# Patient Record
Sex: Female | Born: 1935 | Race: White | Hispanic: No | Smoking: Never smoker
Health system: Southern US, Community
[De-identification: ages and names within clinical notes are randomized; demographics above are authoritative.]

## PROBLEM LIST (undated history)

## (undated) DIAGNOSIS — E039 Hypothyroidism, unspecified: Secondary | ICD-10-CM

## (undated) DIAGNOSIS — J939 Pneumothorax, unspecified: Secondary | ICD-10-CM

## (undated) DIAGNOSIS — I10 Essential (primary) hypertension: Secondary | ICD-10-CM

## (undated) DIAGNOSIS — E119 Type 2 diabetes mellitus without complications: Secondary | ICD-10-CM

## (undated) DIAGNOSIS — D649 Anemia, unspecified: Secondary | ICD-10-CM

## (undated) DIAGNOSIS — E7800 Pure hypercholesterolemia, unspecified: Secondary | ICD-10-CM

## (undated) DIAGNOSIS — C801 Malignant (primary) neoplasm, unspecified: Secondary | ICD-10-CM

## (undated) HISTORY — PX: OTHER SURGICAL HISTORY: SHX169

## (undated) HISTORY — PX: BACK SURGERY: SHX140

## (undated) HISTORY — PX: ABDOMINAL HYSTERECTOMY: SHX81

---

## 2004-08-14 ENCOUNTER — Ambulatory Visit: Payer: Self-pay | Admitting: Internal Medicine

## 2004-09-21 ENCOUNTER — Ambulatory Visit: Payer: Self-pay | Admitting: Unknown Physician Specialty

## 2005-10-17 ENCOUNTER — Ambulatory Visit: Payer: Self-pay | Admitting: Internal Medicine

## 2006-04-16 ENCOUNTER — Ambulatory Visit: Payer: Self-pay | Admitting: Internal Medicine

## 2006-10-20 ENCOUNTER — Ambulatory Visit: Payer: Self-pay | Admitting: Internal Medicine

## 2007-12-16 ENCOUNTER — Ambulatory Visit: Payer: Self-pay | Admitting: Internal Medicine

## 2008-12-30 ENCOUNTER — Ambulatory Visit: Payer: Self-pay | Admitting: Internal Medicine

## 2010-01-16 ENCOUNTER — Ambulatory Visit: Payer: Self-pay | Admitting: Unknown Physician Specialty

## 2010-01-16 HISTORY — PX: OTHER SURGICAL HISTORY: SHX169

## 2010-01-31 ENCOUNTER — Ambulatory Visit: Payer: Self-pay | Admitting: Internal Medicine

## 2011-02-13 ENCOUNTER — Ambulatory Visit: Payer: Self-pay | Admitting: Internal Medicine

## 2011-02-28 ENCOUNTER — Ambulatory Visit: Payer: Self-pay | Admitting: Urology

## 2011-08-01 ENCOUNTER — Emergency Department: Payer: Self-pay | Admitting: Emergency Medicine

## 2011-08-01 LAB — COMPREHENSIVE METABOLIC PANEL
Albumin: 4.2 g/dL (ref 3.4–5.0)
Anion Gap: 14 (ref 7–16)
Calcium, Total: 9.5 mg/dL (ref 8.5–10.1)
Chloride: 96 mmol/L — ABNORMAL LOW (ref 98–107)
EGFR (African American): 35 — ABNORMAL LOW
EGFR (Non-African Amer.): 29 — ABNORMAL LOW
Glucose: 129 mg/dL — ABNORMAL HIGH (ref 65–99)
Osmolality: 283 (ref 275–301)
Potassium: 3.8 mmol/L (ref 3.5–5.1)
SGOT(AST): 57 U/L — ABNORMAL HIGH (ref 15–37)
SGPT (ALT): 53 U/L
Total Protein: 7.9 g/dL (ref 6.4–8.2)

## 2011-08-01 LAB — URINALYSIS, COMPLETE
Bilirubin,UR: NEGATIVE
Glucose,UR: NEGATIVE mg/dL (ref 0–75)
Ketone: NEGATIVE
Leukocyte Esterase: NEGATIVE
Nitrite: NEGATIVE
Ph: 5 (ref 4.5–8.0)
RBC,UR: 1 /HPF (ref 0–5)
Specific Gravity: 1.003 (ref 1.003–1.030)

## 2011-08-01 LAB — CBC
MCH: 33.5 pg (ref 26.0–34.0)
MCHC: 33.9 g/dL (ref 32.0–36.0)
Platelet: 311 10*3/uL (ref 150–440)
RBC: 4.13 10*6/uL (ref 3.80–5.20)
RDW: 11.9 % (ref 11.5–14.5)

## 2011-08-01 LAB — TROPONIN I: Troponin-I: <0.02 ng/mL

## 2012-02-17 ENCOUNTER — Ambulatory Visit: Payer: Self-pay | Admitting: Internal Medicine

## 2012-07-07 IMAGING — US US RENAL KIDNEY
1 series · 17 of 25 positions shown · non-contrast
Comparison: none

REASON FOR EXAM: gross hematuria  atrophic vaginitis
COMMENTS:

[Series 1: us renal kidney · 17 of 53 slices shown]
[im 1/53]
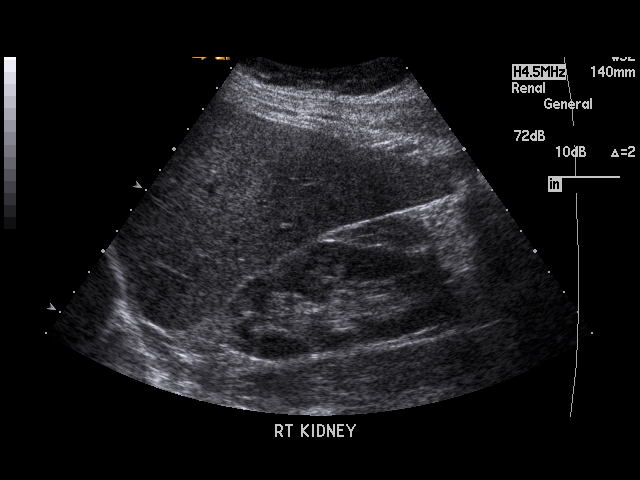
[im 5/53]
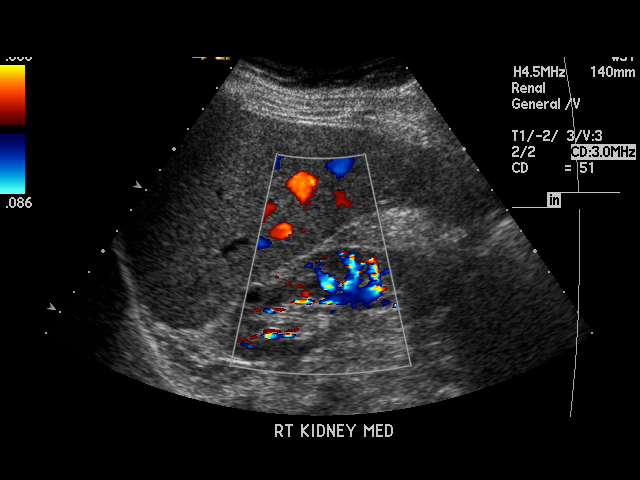
[im 7/53]
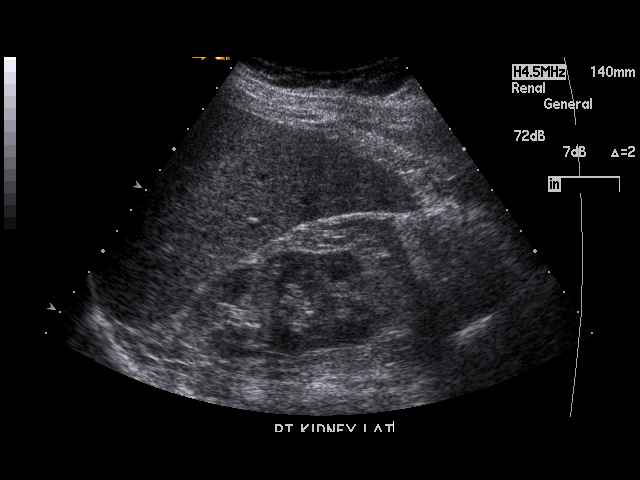
[im 11/53]
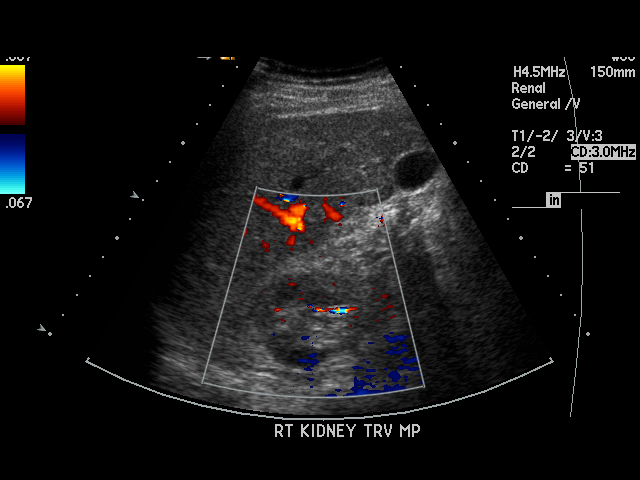
[im 14/53]
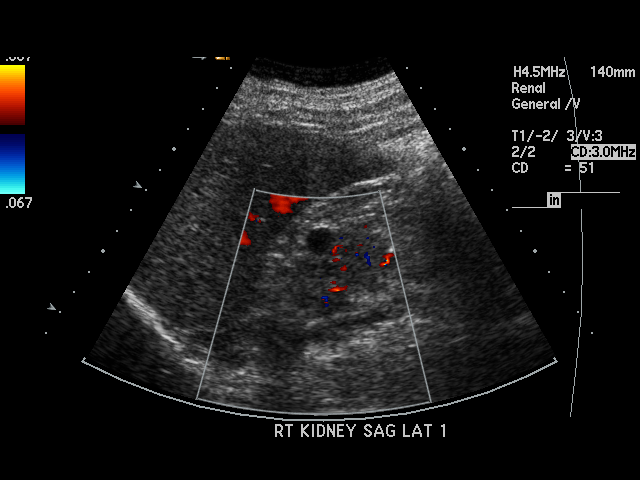
[im 18/53]
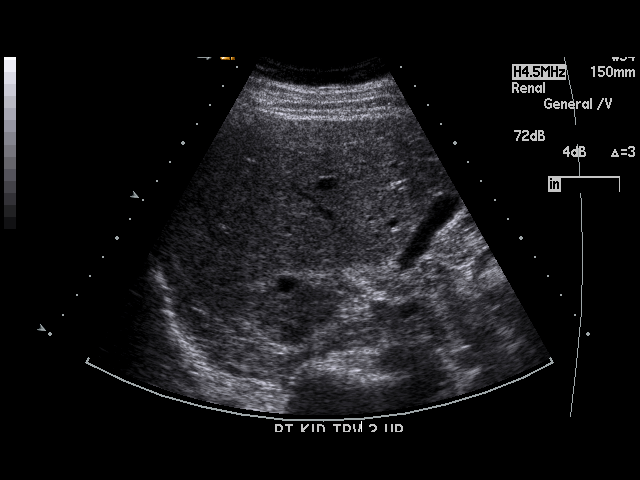
[im 20/53]
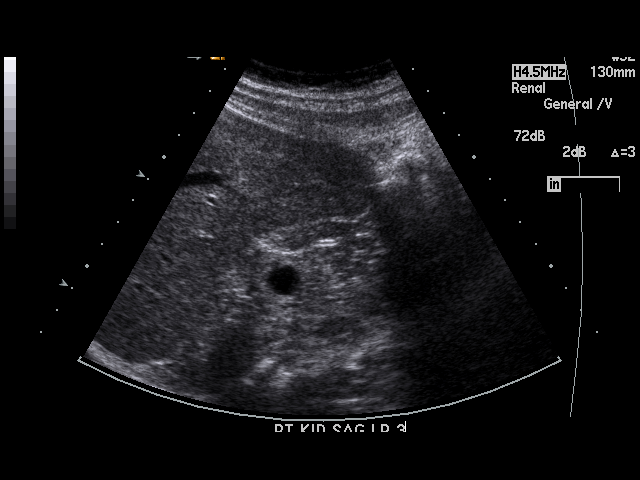
[im 24/53]
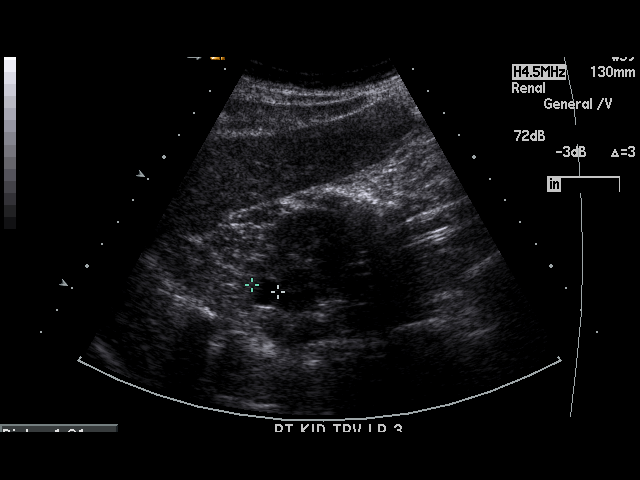
[im 27/53]
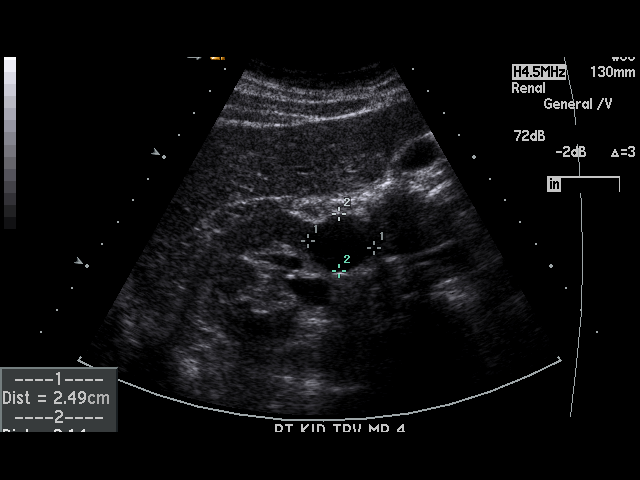
[im 29/53]
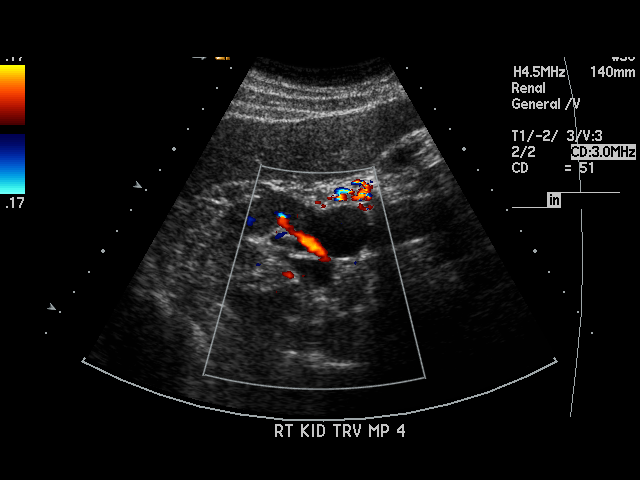
[im 33/53]
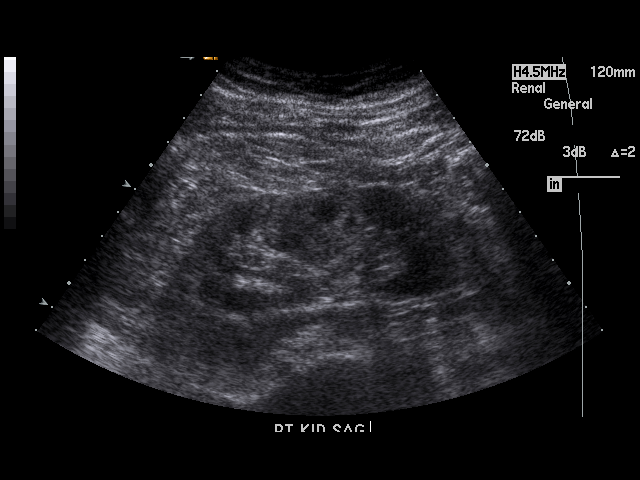
[im 35/53]
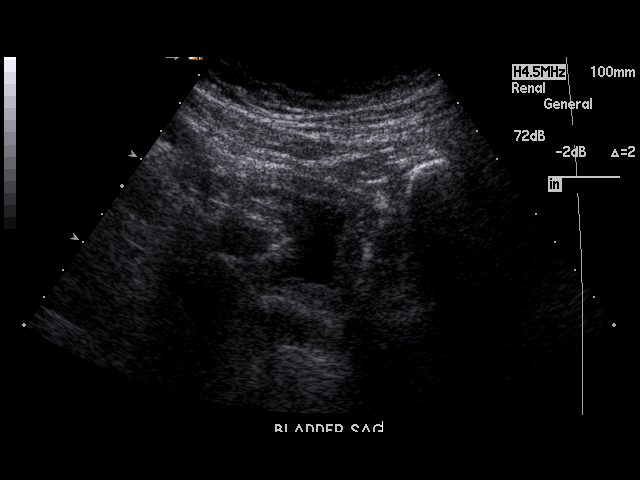
[im 40/53]
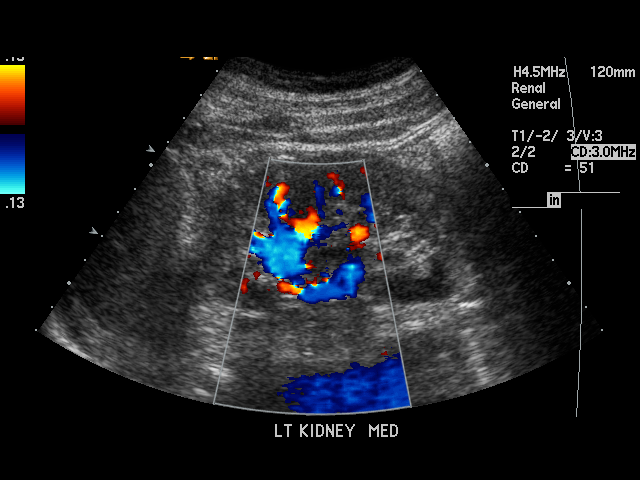
[im 42/53]
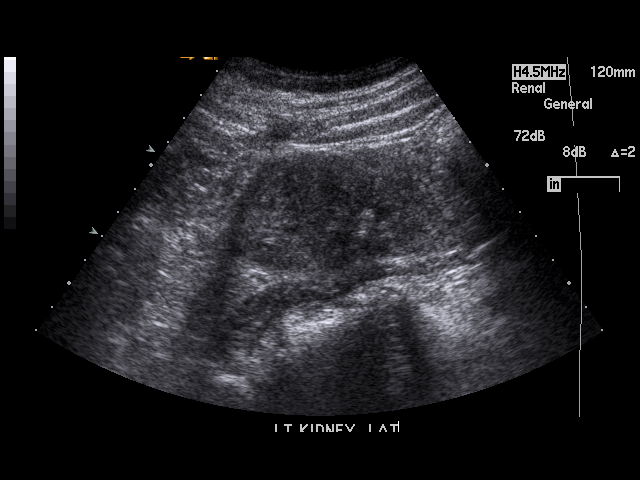
[im 46/53]
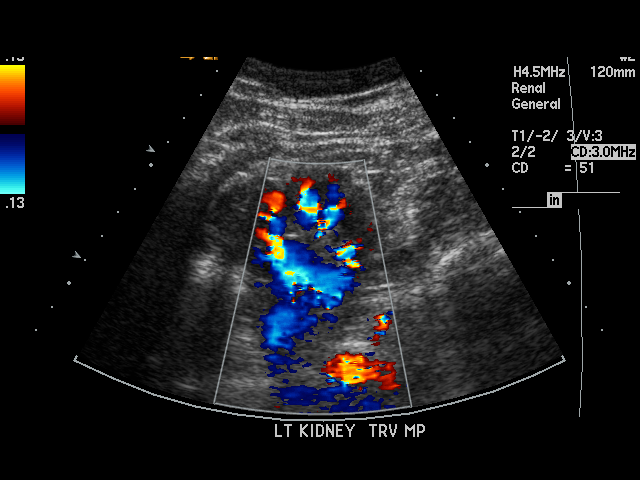
[im 48/53]
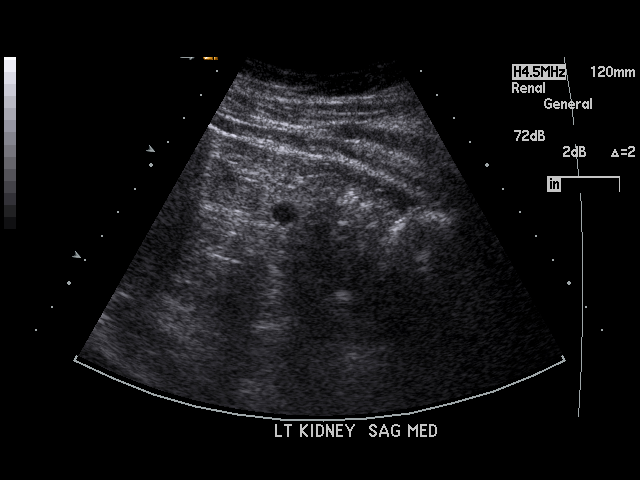
[im 53/53]
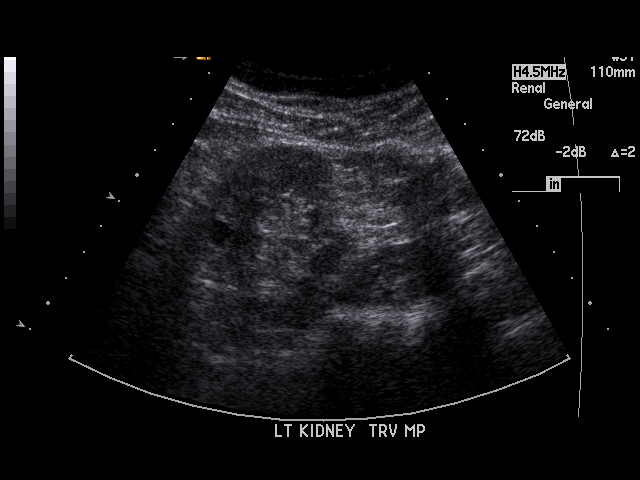

[17 of 25 positions shown; findings below may reference images not displayed]

PROCEDURE:     TIN FUNG - TIN FUNG KIDNEYS  - February 28, 2011  [DATE]

RESULT:     The right kidney measures 9.72 cm x 4.82 cm x 4.0 8 cm and the
left kidney measures 9.04 cm x 4.29 cm x 4.2 cm. No hydronephrosis or other
acute renal abnormality is identified. No solid renal mass lesions are seen.
No renal calcifications are identified. There are multiple right renal cysts
with the largest measuring 2.49 cm at maximum diameter. There is an 8.5 mm
cyst of the left kidney. The visualized portion of the poorly filled urinary
bladder shows no significant abnormalities.
IMPRESSION: 1. No hydronephrosis or other acute renal abnormality is identified.
2. Bilateral renal cysts are noted.

## 2013-02-17 ENCOUNTER — Ambulatory Visit: Payer: Self-pay | Admitting: Internal Medicine

## 2014-06-03 ENCOUNTER — Ambulatory Visit: Payer: Self-pay | Admitting: Internal Medicine

## 2014-07-23 ENCOUNTER — Emergency Department: Payer: Self-pay | Admitting: Emergency Medicine

## 2014-09-23 ENCOUNTER — Ambulatory Visit
Admit: 2014-09-23 | Disposition: A | Discharge status: Home / Self Care | Payer: Self-pay | Attending: Physical Medicine and Rehabilitation | Admitting: Physical Medicine and Rehabilitation

## 2015-03-15 ENCOUNTER — Encounter: Payer: Self-pay | Admitting: *Deleted

## 2015-03-16 ENCOUNTER — Encounter: Payer: Self-pay | Admitting: Anesthesiology

## 2015-03-16 ENCOUNTER — Ambulatory Visit: Payer: Medicare Other | Admitting: Anesthesiology

## 2015-03-16 ENCOUNTER — Encounter
Admission: RE | Disposition: A | Discharge status: Home / Self Care | Payer: Self-pay | Source: Ambulatory Visit | Attending: Unknown Physician Specialty

## 2015-03-16 ENCOUNTER — Ambulatory Visit
Admission: RE | Admit: 2015-03-16 | Discharge: 2015-03-16 | Disposition: A | Discharge status: Home / Self Care | Payer: Medicare Other | Source: Ambulatory Visit | Attending: Unknown Physician Specialty | Admitting: Unknown Physician Specialty

## 2015-03-16 DIAGNOSIS — K5730 Diverticulosis of large intestine without perforation or abscess without bleeding: Secondary | ICD-10-CM | POA: Diagnosis not present

## 2015-03-16 DIAGNOSIS — E119 Type 2 diabetes mellitus without complications: Secondary | ICD-10-CM | POA: Diagnosis not present

## 2015-03-16 DIAGNOSIS — E7800 Pure hypercholesterolemia, unspecified: Secondary | ICD-10-CM | POA: Insufficient documentation

## 2015-03-16 DIAGNOSIS — I10 Essential (primary) hypertension: Secondary | ICD-10-CM | POA: Insufficient documentation

## 2015-03-16 DIAGNOSIS — D125 Benign neoplasm of sigmoid colon: Secondary | ICD-10-CM | POA: Diagnosis not present

## 2015-03-16 DIAGNOSIS — K640 First degree hemorrhoids: Secondary | ICD-10-CM | POA: Insufficient documentation

## 2015-03-16 DIAGNOSIS — Z79899 Other long term (current) drug therapy: Secondary | ICD-10-CM | POA: Insufficient documentation

## 2015-03-16 DIAGNOSIS — E039 Hypothyroidism, unspecified: Secondary | ICD-10-CM | POA: Insufficient documentation

## 2015-03-16 DIAGNOSIS — D124 Benign neoplasm of descending colon: Secondary | ICD-10-CM | POA: Insufficient documentation

## 2015-03-16 DIAGNOSIS — Z86010 Personal history of colonic polyps: Secondary | ICD-10-CM | POA: Diagnosis present

## 2015-03-16 HISTORY — DX: Pneumothorax, unspecified: J93.9

## 2015-03-16 HISTORY — DX: Type 2 diabetes mellitus without complications: E11.9

## 2015-03-16 HISTORY — DX: Essential (primary) hypertension: I10

## 2015-03-16 HISTORY — DX: Anemia, unspecified: D64.9

## 2015-03-16 HISTORY — PX: COLONOSCOPY WITH PROPOFOL: SHX5780

## 2015-03-16 HISTORY — DX: Pure hypercholesterolemia, unspecified: E78.00

## 2015-03-16 HISTORY — DX: Hypothyroidism, unspecified: E03.9

## 2015-03-16 LAB — GLUCOSE, CAPILLARY: GLUCOSE-CAPILLARY: 120 mg/dL — AB (ref 65–99)

## 2015-03-16 SURGERY — COLONOSCOPY WITH PROPOFOL
Anesthesia: General

## 2015-03-16 MED ORDER — LIDOCAINE HCL (PF) 2 % IJ SOLN
INTRAMUSCULAR | Status: DC | PRN
  Administered 2015-03-16: 50 mg

## 2015-03-16 MED ORDER — PROPOFOL 10 MG/ML IV BOLUS
INTRAVENOUS | Status: DC | PRN
  Administered 2015-03-16 (×2): 10 mg via INTRAVENOUS
  Administered 2015-03-16: 30 mg via INTRAVENOUS
  Administered 2015-03-16: 10 mg via INTRAVENOUS

## 2015-03-16 MED ORDER — SODIUM CHLORIDE 0.9 % IV SOLN
INTRAVENOUS | Status: DC
  Administered 2015-03-16: 1000 mL via INTRAVENOUS
  Administered 2015-03-16: 08:00:00 via INTRAVENOUS

## 2015-03-16 MED ORDER — PROPOFOL 500 MG/50ML IV EMUL
INTRAVENOUS | Status: DC | PRN
  Administered 2015-03-16: 50 ug/kg/min via INTRAVENOUS

## 2015-03-16 MED ORDER — SODIUM CHLORIDE 0.9 % IV SOLN
INTRAVENOUS | Status: DC
Start: 2015-03-16 — End: 2015-03-16

## 2015-03-16 MED ORDER — FENTANYL CITRATE (PF) 100 MCG/2ML IJ SOLN
INTRAMUSCULAR | Status: DC | PRN
  Administered 2015-03-16: 50 ug via INTRAVENOUS

## 2015-03-16 NOTE — Transfer of Care (Signed)
Immediate Anesthesia Transfer of Care Note  Patient: Kim Hoover  Procedure(s) Performed: Procedure(s): COLONOSCOPY WITH PROPOFOL (N/A)  Patient Location: PACU  Anesthesia Type:General  Level of Consciousness: sedated  Airway & Oxygen Therapy: Patient Spontanous Breathing and Patient connected to nasal cannula oxygen  Post-op Assessment: Report given to RN and Post -op Vital signs reviewed and stable  Post vital signs: Reviewed and stable  Last Vitals:  Filed Vitals:   03/16/15 0718  BP: 142/54  Pulse: 69  Temp: 37.5 C  Resp: 16    Complications: No apparent anesthesia complications

## 2015-03-16 NOTE — Op Note (Signed)
[facility] Gastroenterology Patient Name: Kim Hoover Procedure Date: 03/16/2015 8:04 AM MRN: [id number] Account #: [id number] Date of Birth: 25-Dec-1933 Admit Type: Outpatient Age: 81 Room: [facility] ENDO ROOM 1 Gender: Female Note Status: Finalized Procedure:         Colonoscopy Indications:       High risk colon cancer surveillance: Personal history of                     colonic polyps Providers:         Manya Silvas, MD Referring MD:      Ocie Cornfield. Ouida Sills, MD (Referring MD) Medicines:         Propofol per Anesthesia Complications:     No immediate complications. Procedure:         Pre-Anesthesia Assessment:                    - After reviewing the risks and benefits, the patient was                     deemed in satisfactory condition to undergo the procedure.                    After obtaining informed consent, the colonoscope was                     passed under direct vision. Throughout the procedure, the                     patient's blood pressure, pulse, and oxygen saturations                     were monitored continuously. The Colonoscope was                     introduced through the anus and advanced to the the cecum,                     identified by appendiceal orifice and ileocecal valve. The                     colonoscopy was performed without difficulty. The patient                     tolerated the procedure well. The quality of the bowel                     preparation was excellent. Findings:      Three sessile polyps were found in the recto-sigmoid colon and in the       descending colon. The polyps were diminutive in size. These polyps were       removed with a jumbo cold forceps. Resection and retrieval were complete.      A few small-mouthed diverticula were found in the transverse colon.      Internal hemorrhoids were found during endoscopy. The hemorrhoids were       small and Grade I (internal hemorrhoids that do not  prolapse). Impression:        - Three diminutive polyps at the recto-sigmoid colon and                     in the descending colon. Resected and retrieved.                    -  Diverticulosis in the transverse colon.                    - Internal hemorrhoids. Recommendation:    - Await pathology results. Due to age no repeat                     recommended. Manya Silvas, MD 03/16/2015 8:36:05 AM This report has been signed electronically. Number of Addenda: 0 Note Initiated On: 03/16/2015 8:04 AM Scope Withdrawal Time: 0 hours 11 minutes 41 seconds  Total Procedure Duration: 0 hours 23 minutes 20 seconds       [facility]

## 2015-03-16 NOTE — H&P (Signed)
Primary Care Physician:  Kirk Ruths., MD Primary Gastroenterologist:  Dr. Vira Agar  Pre-Procedure History & Physical: HPI:  Kim Hoover is a 81 y.o. female is here for an colonoscopy.   Past Medical History  Diagnosis Date  . Hypertension   . Hypothyroidism   . Diabetes mellitus without complication ([place])   . Anemia   . Pure hypercholesterolemia   . Pneumothorax     Past Surgical History  Procedure Laterality Date  . Abdominal hysterectomy    . Back surgery    . Enucleation of eye    . Colon polyps  01/16/10    Prior to Admission medications   Medication Sig Start Date End Date Taking? Authorizing Provider  bisacodyl (BISACODYL) 5 MG EC tablet Take 5 mg by mouth daily as needed for moderate constipation.   Yes Historical Provider, MD  carvedilol (COREG) 25 MG tablet Take 25 mg by mouth 2 (two) times daily with a meal.   Yes Historical Provider, MD  HYDROcodone-acetaminophen (NORCO/VICODIN) 5-325 MG tablet Take 1 tablet by mouth every 6 (six) hours as needed for moderate pain.   Yes Historical Provider, MD  levothyroxine (SYNTHROID, LEVOTHROID) 50 MCG tablet Take 50 mcg by mouth daily before breakfast.   Yes Historical Provider, MD  losartan-hydrochlorothiazide (HYZAAR) 50-12.5 MG tablet Take 1 tablet by mouth daily.   Yes Historical Provider, MD  Multiple Vitamins-Iron (MULTIVITAMINS WITH IRON) TABS tablet Take 1 tablet by mouth daily.   Yes Historical Provider, MD  PARoxetine (PAXIL) 20 MG tablet Take 20 mg by mouth daily.   Yes Historical Provider, MD  polyethylene glycol-electrolytes (NULYTELY/GOLYTELY) 420 G solution Take 4,000 mLs by mouth once.   Yes Historical Provider, MD  simvastatin (ZOCOR) 40 MG tablet Take 40 mg by mouth daily.   Yes Historical Provider, MD  felodipine (PLENDIL) 10 MG 24 hr tablet Take 10 mg by mouth daily.    Historical Provider, MD    Allergies as of 01/11/2015  . (Not on File)    History reviewed. No pertinent family  history.  Social History   Social History  . Marital Status: Married    Spouse Name: N/A  . Number of Children: N/A  . Years of Education: N/A   Occupational History  . Not on file.   Social History Main Topics  . Smoking status: Never Smoker   . Smokeless tobacco: Not on file  . Alcohol Use: Not on file  . Drug Use: Not on file  . Sexual Activity: Not on file   Other Topics Concern  . Not on file   Social History Narrative  . No narrative on file    Review of Systems: See HPI, otherwise negative ROS  Physical Exam: BP 142/54 mmHg  Pulse 69  Temp(Src) 99.5 F (37.5 C) (Tympanic)  Resp 16  Ht 5\' 7"  (1.702 m)  Wt 68.04 kg (150 lb)  BMI 23.49 kg/m2  SpO2 98% General:   Alert,  pleasant and cooperative in NAD Head:  Normocephalic and atraumatic. Neck:  Supple; no masses or thyromegaly. Lungs:  Clear throughout to auscultation.    Heart:  Regular rate and rhythm. Abdomen:  Soft, nontender and nondistended. Normal bowel sounds, without guarding, and without rebound.   Neurologic:  Alert and  oriented x4;  grossly normal neurologically.  Impression/Plan: Kim Hoover is here for an colonoscopy to be performed for [facility] colon polyps  Risks, benefits, limitations, and alternatives regarding  colonoscopy have been reviewed with the patient.  Questions have been answered.  All parties agreeable.   Gaylyn Cheers, MD  03/16/2015, 8:00 AM

## 2015-03-16 NOTE — Anesthesia Preprocedure Evaluation (Signed)
Anesthesia Evaluation  Patient identified by MRN, date of birth, ID band Patient awake    Reviewed: Allergy & Precautions, H&P , NPO status , Patient's Chart, lab work & pertinent test results  History of Anesthesia Complications Negative for: history of anesthetic complications  Airway Mallampati: III  TM Distance: >3 FB Neck ROM: limited    Dental  (+) Poor Dentition   Pulmonary neg pulmonary ROS, neg shortness of breath,    Pulmonary exam normal breath sounds clear to auscultation       Cardiovascular Exercise Tolerance: Good hypertension, (-) angina(-) Past MI and (-) DOE negative cardio ROS Normal cardiovascular exam Rhythm:regular Rate:Normal     Neuro/Psych negative neurological ROS  negative psych ROS   GI/Hepatic negative GI ROS, Neg liver ROS,   Endo/Other  diabetes, Type 2Hypothyroidism   Renal/GU negative Renal ROS  negative genitourinary   Musculoskeletal   Abdominal   Peds  Hematology negative hematology ROS (+)   Anesthesia Other Findings Past Medical History:   Hypertension                                                 Hypothyroidism                                               Diabetes mellitus without complication ([place])                 Anemia                                                       Pure hypercholesterolemia                                    Pneumothorax                                                Past Surgical History:   ABDOMINAL HYSTERECTOMY                                        BACK SURGERY                                                  enucleation of eye                                            colon polyps  01/16/10     BMI    Body Mass Index   23.48 kg/m 2      Reproductive/Obstetrics negative OB ROS                             Anesthesia Physical Anesthesia Plan  ASA: III  Anesthesia Plan:  General   Post-op Pain Management:    Induction:   Airway Management Planned:   Additional Equipment:   Intra-op Plan:   Post-operative Plan:   Informed Consent: I have reviewed the patients History and Physical, chart, labs and discussed the procedure including the risks, benefits and alternatives for the proposed anesthesia with the patient or authorized representative who has indicated his/her understanding and acceptance.   Dental Advisory Given  Plan Discussed with: Anesthesiologist, CRNA and Surgeon  Anesthesia Plan Comments:         Anesthesia Quick Evaluation

## 2015-03-16 NOTE — Anesthesia Postprocedure Evaluation (Signed)
  Anesthesia Post-op Note  Patient: Kim Hoover  Procedure(s) Performed: Procedure(s): COLONOSCOPY WITH PROPOFOL (N/A)  Anesthesia type:General  Patient location: PACU  Post pain: Pain level controlled  Post assessment: Post-op Vital signs reviewed, Patient's Cardiovascular Status Stable, Respiratory Function Stable, Patent Airway and No signs of Nausea or vomiting  Post vital signs: Reviewed and stable  Last Vitals:  Filed Vitals:   03/16/15 0838  BP: 115/56  Pulse: 60  Temp: 35.8 C  Resp: 18    Level of consciousness: awake, alert  and patient cooperative  Complications: No apparent anesthesia complications

## 2015-03-17 ENCOUNTER — Encounter: Payer: Self-pay | Admitting: Unknown Physician Specialty

## 2015-03-17 LAB — SURGICAL PATHOLOGY

## 2015-06-01 ENCOUNTER — Other Ambulatory Visit: Payer: Self-pay | Admitting: Internal Medicine

## 2015-06-01 DIAGNOSIS — Z1231 Encounter for screening mammogram for malignant neoplasm of breast: Secondary | ICD-10-CM

## 2015-06-06 ENCOUNTER — Ambulatory Visit
Admission: RE | Admit: 2015-06-06 | Discharge: 2015-06-06 | Disposition: A | Discharge status: Home / Self Care | Payer: Medicare Other | Source: Ambulatory Visit | Attending: Internal Medicine | Admitting: Internal Medicine

## 2015-06-06 ENCOUNTER — Other Ambulatory Visit: Payer: Self-pay | Admitting: Internal Medicine

## 2015-06-06 DIAGNOSIS — Z1231 Encounter for screening mammogram for malignant neoplasm of breast: Secondary | ICD-10-CM | POA: Insufficient documentation

## 2015-06-06 HISTORY — DX: Malignant (primary) neoplasm, unspecified: C80.1

## 2018-06-20 ENCOUNTER — Other Ambulatory Visit
Admission: RE | Admit: 2018-06-20 | Discharge: 2018-06-20 | Disposition: A | Discharge status: Home / Self Care | Payer: Medicare Other | Source: Ambulatory Visit | Attending: Nurse Practitioner | Admitting: Nurse Practitioner

## 2018-06-20 DIAGNOSIS — R197 Diarrhea, unspecified: Secondary | ICD-10-CM | POA: Diagnosis present

## 2018-06-20 LAB — GASTROINTESTINAL PANEL BY PCR, STOOL (REPLACES STOOL CULTURE)

## 2018-06-20 LAB — C DIFFICILE QUICK SCREEN W PCR REFLEX
C Diff antigen: NEGATIVE
C Diff interpretation: NOT DETECTED
C Diff toxin: NEGATIVE

## 2019-02-04 ENCOUNTER — Other Ambulatory Visit: Payer: Self-pay

## 2019-02-04 ENCOUNTER — Ambulatory Visit: Payer: Medicare Other | Admitting: Urology

## 2019-02-04 ENCOUNTER — Encounter: Payer: Self-pay | Admitting: Urology

## 2019-02-04 VITALS — BP 151/76 | HR 75 | Ht 67.0 in | Wt 155.0 lb

## 2019-02-04 DIAGNOSIS — N390 Urinary tract infection, site not specified: Secondary | ICD-10-CM | POA: Diagnosis not present

## 2019-02-04 LAB — BLADDER SCAN AMB NON-IMAGING: Scan Result: 12

## 2019-02-04 MED ORDER — TRIMETHOPRIM 100 MG PO TABS: 100.0000 mg | ORAL_TABLET | Freq: Every day | ORAL | 2 refills | Status: DC

## 2019-02-04 NOTE — Progress Notes (Signed)
02/04/19 1:27 PM   Kim Hoover 03-06-1934 [id number]  Referring provider: Kirk Ruths, MD [facility] [place],  [place] [address]  CC: Recurrent UTIs  HPI: I saw Ms. Kim Hoover in urology clinic in consultation for recurrent UTIs from Dr. Ouida Sills.  She is a healthy 85 year old female that had an Enterobacter UTI in July 2020, and reportedly has a history of recurrent infections previously.  On chart review, she also had a Proteus UTI in January 2019 in June 2018.  She was previously followed by Dr. Ernst Spell over 10 years ago who told her she "had a nonhealing spot on her bladder."  It sounds like she had a cystoscopy and cytology that was negative, but no biopsy was ever performed.  It sounds like she may have been on prophylactic antibiotics at that time.  When she has a UTI, she has symptoms of dysuria and hematuria.  She denies any gross hematuria without associated UTI symptoms.  She denies any abdominal pain, flank pain, or weight loss.  She is a never smoker and previously worked as a Pharmacist, hospital.  There are no aggravating or alleviating factors, severity is mild.  Urinalysis today is benign with 0-5 WBCs, 0-2 RBCs, 0 epithelial cells, no bacteria, nitrite negative.  PVR is 12 mL.   PMH: Past Medical History:  Diagnosis Date  . Anemia   . Cancer ([place])    skin  . Diabetes mellitus without complication ([place])   . Hypertension   . Hypothyroidism   . Pneumothorax   . Pure hypercholesterolemia     Surgical History: Past Surgical History:  Procedure Laterality Date  . ABDOMINAL HYSTERECTOMY    . BACK SURGERY    . colon polyps  01/16/10  . COLONOSCOPY WITH PROPOFOL N/A 03/16/2015   Procedure: COLONOSCOPY WITH PROPOFOL;  Surgeon: Manya Silvas, MD;  Location: [facility] ENDOSCOPY;  Service: Endoscopy;  Laterality: N/A;  . enucleation of eye      Allergies:  Allergies  Allergen Reactions  . Ace Inhibitors Cough  . Codeine Nausea Only    Family History: Family History  Problem Relation Age of Onset  . Breast cancer Neg Hx     Social History:  reports that she has never smoked. She has never used smokeless tobacco. She reports current alcohol use of about 3.0 standard drinks of alcohol per week. She reports that she does not use drugs.  ROS: Please see flowsheet from today's date for complete review of systems.  Physical Exam: BP (!) 151/76   Pulse 75   Ht 5\' 7"  (1.702 m)   Wt 155 lb (70.3 kg)   BMI 24.28 kg/m    Constitutional:  Alert and oriented, No acute distress. Cardiovascular: No clubbing, cyanosis, or edema. Respiratory: Normal respiratory effort, no increased work of breathing. GI: Abdomen is soft, nontender, nondistended, no abdominal masses Lymph: No cervical or inguinal lymphadenopathy. Skin: No rashes, bruises or suspicious lesions. Neurologic: Grossly intact, no focal deficits, moving all 4 extremities. Psychiatric: Normal mood and affect.  Laboratory Data: Urinalysis today 0-5 WBCs, 0-2 RBCs, no bacteria, nitrite negative  Pertinent Imaging: None to review  Assessment & Plan:   In summary, the patient is a healthy 85 year old female with what sounds like a prior history of recurrent urinary tract infections, as well as a "non-healing spot on her bladder" over 10 years ago who presents with a single recent UTI in July 2020.  She is currently asymptomatic, and urinalysis today  is benign.  We discussed the evaluation and treatment of patients with recurrent UTIs at length.  We specifically discussed the differences between asymptomatic bacteriuria and true urinary tract infection.  We discussed the AUA definition of recurrent UTI of at least 2 culture proven symptomatic acute cystitis episodes in a 19-month period, or 3 within a 1 year period.  We discussed the importance of culture directed antibiotic treatment, and antibiotic stewardship.  First-line therapy includes nitrofurantoin(5 days),  Bactrim(3 days), or fosfomycin(3 g single dose).  Possible etiologies of recurrent infection include periurethral tissue atrophy in postmenopausal woman, constipation, sexual activity, incomplete emptying, anatomic abnormalities, and even genetic predisposition.  Finally, we discussed the role of perineal hygiene, timed voiding, adequate hydration, topical vaginal estrogen, cranberry prophylaxis, and low-dose antibiotic prophylaxis.  Cranberry prophylaxis twice daily Trimethoprim 100 mg daily x3 months RTC 3 months for symptom check  Billey Co, MD  [facility] [address] [place], [place] [address] [phone]

## 2019-02-04 NOTE — Patient Instructions (Addendum)
1. Start over the counter Cranberry tablets twice daily to prevent infection  Urinary Tract Infection, Adult A urinary tract infection (UTI) is an infection of any part of the urinary tract. The urinary tract includes:  The kidneys.  The ureters.  The bladder.  The urethra. These organs make, store, and get rid of pee (urine) in the body. What are the causes? This is caused by germs (bacteria) in your genital area. These germs grow and cause swelling (inflammation) of your urinary tract. What increases the risk? You are more likely to develop this condition if:  You have a small, thin tube (catheter) to drain pee.  You cannot control when you pee or poop (incontinence).  You are female, and: ? You use these methods to prevent pregnancy: ? A medicine that kills sperm (spermicide). ? A device that blocks sperm (diaphragm). ? You have low levels of a female hormone (estrogen). ? You are pregnant.  You have genes that add to your risk.  You are sexually active.  You take antibiotic medicines.  You have trouble peeing because of: ? A prostate that is bigger than normal, if you are female. ? A blockage in the part of your body that drains pee from the bladder (urethra). ? A kidney stone. ? A nerve condition that affects your bladder (neurogenic bladder). ? Not getting enough to drink. ? Not peeing often enough.  You have other conditions, such as: ? Diabetes. ? A weak disease-fighting system (immune system). ? Sickle cell disease. ? Gout. ? Injury of the spine. What are the signs or symptoms? Symptoms of this condition include:  Needing to pee right away (urgently).  Peeing often.  Peeing small amounts often.  Pain or burning when peeing.  Blood in the pee.  Pee that smells bad or not like normal.  Trouble peeing.  Pee that is cloudy.  Fluid coming from the vagina, if you are female.  Pain in the belly or lower back. Other symptoms include:  Throwing  up (vomiting).  No urge to eat.  Feeling mixed up (confused).  Being tired and grouchy (irritable).  A fever.  Watery poop (diarrhea). How is this treated? This condition may be treated with:  Antibiotic medicine.  Other medicines.  Drinking enough water. Follow these instructions at home:  Medicines  Take over-the-counter and prescription medicines only as told by your doctor.  If you were prescribed an antibiotic medicine, take it as told by your doctor. Do not stop taking it even if you start to feel better. General instructions  Make sure you: ? Pee until your bladder is empty. ? Do not hold pee for a long time. ? Empty your bladder after sex. ? Wipe from front to back after pooping if you are a female. Use each tissue one time when you wipe.  Drink enough fluid to keep your pee pale yellow.  Keep all follow-up visits as told by your doctor. This is important. Contact a doctor if:  You do not get better after 1-2 days.  Your symptoms go away and then come back. Get help right away if:  You have very bad back pain.  You have very bad pain in your lower belly.  You have a fever.  You are sick to your stomach (nauseous).  You are throwing up. Summary  A urinary tract infection (UTI) is an infection of any part of the urinary tract.  This condition is caused by germs in your genital area.  There  are many risk factors for a UTI. These include having a small, thin tube to drain pee and not being able to control when you pee or poop.  Treatment includes antibiotic medicines for germs.  Drink enough fluid to keep your pee pale yellow. This information is not intended to replace advice given to you by your health care provider. Make sure you discuss any questions you have with your health care provider. Document Released: 10/30/2007 Document Revised: 04/30/2018 Document Reviewed: 11/20/2017 Elsevier Patient Education  2020 [employer].

## 2019-02-05 LAB — URINALYSIS, COMPLETE
Bilirubin, UA: NEGATIVE
Glucose, UA: NEGATIVE
Ketones, UA: NEGATIVE
Leukocytes,UA: NEGATIVE
Nitrite, UA: NEGATIVE
Protein,UA: NEGATIVE
Specific Gravity, UA: 1.01 (ref 1.005–1.030)
Urobilinogen, Ur: 0.2 mg/dL (ref 0.2–1.0)
pH, UA: 5.5 (ref 5.0–7.5)

## 2019-02-05 LAB — MICROSCOPIC EXAMINATION: Bacteria, UA: NONE SEEN

## 2019-05-08 ENCOUNTER — Other Ambulatory Visit: Payer: Self-pay | Admitting: Urology

## 2019-06-08 ENCOUNTER — Other Ambulatory Visit: Payer: Self-pay | Admitting: Urology

## 2019-06-08 NOTE — Telephone Encounter (Signed)
Ok to refill 

## 2019-08-10 ENCOUNTER — Other Ambulatory Visit: Payer: Self-pay | Admitting: Urology

## 2021-11-21 ENCOUNTER — Encounter: Payer: Self-pay | Admitting: *Deleted

## 2021-11-26 ENCOUNTER — Ambulatory Visit: Payer: Medicare PPO | Admitting: Physician Assistant

## 2021-11-26 ENCOUNTER — Encounter: Payer: Self-pay | Admitting: Physician Assistant

## 2021-11-26 VITALS — BP 149/74 | HR 64 | Ht 67.0 in | Wt 152.0 lb

## 2021-11-26 DIAGNOSIS — R3915 Urgency of urination: Secondary | ICD-10-CM

## 2021-11-26 DIAGNOSIS — R3129 Other microscopic hematuria: Secondary | ICD-10-CM | POA: Diagnosis not present

## 2021-11-26 DIAGNOSIS — N390 Urinary tract infection, site not specified: Secondary | ICD-10-CM | POA: Diagnosis not present

## 2021-11-26 LAB — URINALYSIS, COMPLETE
Bilirubin, UA: NEGATIVE
Glucose, UA: NEGATIVE
Ketones, UA: NEGATIVE
Leukocytes,UA: NEGATIVE
Nitrite, UA: NEGATIVE
Protein,UA: NEGATIVE
Specific Gravity, UA: 1.015 (ref 1.005–1.030)
Urobilinogen, Ur: 0.2 mg/dL (ref 0.2–1.0)
pH, UA: 7.5 (ref 5.0–7.5)

## 2021-11-26 LAB — MICROSCOPIC EXAMINATION: Epithelial Cells (non renal): NONE SEEN /hpf (ref 0–10)

## 2021-11-26 NOTE — Progress Notes (Signed)
11/26/2021 9:34 AM   Kim Hoover 07-Oct-1933 [id number]  CC: Chief Complaint  Patient presents with   Recurrent UTI   HPI: Kim Hoover is a 88 y.o. female with PMH diabetes recently on Jardiance, CKD, recurrent UTI previously on suppressive trimethoprim per Dr. Diamantina Providence, and a remote "nonhealing spot on her bladder" managed by Dr. Ernst Spell who presents today for evaluation of recurrent UTIs.  She is accompanied today by her son, Gwynneth Macleod, who contributes to HPI.  She has been seen 4 times by urgent and primary care in the past 2 months for evaluation of possible UTI.  She completed her last course of antibiotics less than 2 weeks ago and was taken off Jardiance at around the same time due to concerns that these could be contributing to her infections.  Today she reports no dysuria, but she does have baseline frequency up to every 30 to 60 minutes, urgency, urge incontinence, and stress incontinence.  She has had episodes of gross hematuria associated with "infections."  She is frustrated with the frequency of her infections and is curious what may be causing them.  Recent culture data as below. Date Symptoms Urine Microscopy Result Culture Result Treatment  04/12/2021 Dysuria, hematuria >50 RBCs, >50 WBCs, many bacteria Mixed urogenital flora Unclear  04/21/2021 Dysuria, urgency, frequency 10-50 WBCs, 4-10 RBCs, moderate bacteria Klebsiella pneumoniae resistant to ampicillin, intermediate to nitrofurantoin Cefdinir '300mg'$  BID x7 days  09/24/2021 Dysuria, frequency, blood with wiping >50 WBCs, >50 RBCs, many bacteria, moderate squamous epithelial cells Klebsiella pneumoniae resistant to ampicillin, intermediate to nitrofurantoin Cefdinir '300mg'$  BID x10 days  10/14/2021 Dysuria, urgency, frequency, hematuria 10-50 WBCs, 4-10 RBCs, moderate bacteria Klebsiella pneumoniae resistant to ampicillin, intermediate to meropenem and nitrofurantoin Augmentin 875-'125mg'$  BID x7 days  11/03/2021  Dysuria, urgency, frequency, hematuria 10-50 WBCs, 10-50 RBCs, few bacteria Mixed urogenital flora Cefdinir '300mg'$  BID x7 days  11/16/2021 Dysuria, frequency >50 WBCs, >50 RBCs, many bacteria, moderate squamous epithelial cells Mixed urogenital flora Unclear   In-office catheterized UA today positive for recent lysed blood; urine microscopy with 3-10 RBCs/HPF.  PMH: Past Medical History:  Diagnosis Date   Anemia    Cancer ([place])    skin   Diabetes mellitus without complication ([place])    Hypertension    Hypothyroidism    Pneumothorax    Pure hypercholesterolemia     Surgical History: Past Surgical History:  Procedure Laterality Date   ABDOMINAL HYSTERECTOMY     BACK SURGERY     colon polyps  01/16/10   COLONOSCOPY WITH PROPOFOL N/A 03/16/2015   Procedure: COLONOSCOPY WITH PROPOFOL;  Surgeon: Manya Silvas, MD;  Location: [facility];  Service: Endoscopy;  Laterality: N/A;   enucleation of eye      Home Medications:  Allergies as of 11/26/2021       Reactions   Ace Inhibitors Cough   Codeine Nausea Only        Medication List        Accurate as of November 26, 2021  9:34 AM. If you have any questions, ask your nurse or doctor.          bisacodyl 5 MG EC tablet Generic drug: bisacodyl Take 5 mg by mouth daily as needed for moderate constipation.   carvedilol 25 MG tablet Commonly known as: COREG Take 25 mg by mouth 2 (two) times daily with a meal.   Co Q-10 400 MG Caps Take by mouth.   felodipine 10 MG 24 hr tablet Commonly known  as: PLENDIL Take 10 mg by mouth daily.   fenoprofen 600 MG Tabs tablet Commonly known as: NALFON Take 600 mg by mouth.   HYDROcodone-acetaminophen 5-325 MG tablet Commonly known as: NORCO/VICODIN Take 1 tablet by mouth every 6 (six) hours as needed for moderate pain.   levothyroxine 50 MCG tablet Commonly known as: SYNTHROID Take 50 mcg by mouth daily before breakfast.   losartan-hydrochlorothiazide 50-12.5 MG  tablet Commonly known as: HYZAAR Take 1 tablet by mouth daily.   magnesium oxide 400 MG tablet Commonly known as: MAG-OX Take by mouth.   multivitamins with iron Tabs tablet Take 1 tablet by mouth daily.   PARoxetine 20 MG tablet Commonly known as: PAXIL Take 20 mg by mouth daily.   polyethylene glycol-electrolytes 420 g solution Commonly known as: NuLYTELY Take 4,000 mLs by mouth once.   sertraline 50 MG tablet Commonly known as: ZOLOFT Take by mouth.   simvastatin 40 MG tablet Commonly known as: ZOCOR Take 40 mg by mouth daily.   trimethoprim 100 MG tablet Commonly known as: TRIMPEX TAKE 1 TABLET BY MOUTH EVERY DAY        Allergies:  Allergies  Allergen Reactions   Ace Inhibitors Cough   Codeine Nausea Only    Family History: Family History  Problem Relation Age of Onset   Breast cancer Neg Hx     Social History:   reports that she has never smoked. She has never used smokeless tobacco. She reports current alcohol use of about 3.0 standard drinks of alcohol per week. She reports that she does not use drugs.  Physical Exam: BP (!) 149/74   Pulse 64   Ht '5\' 7"'$  (1.702 m)   Wt 152 lb (68.9 kg)   BMI 23.81 kg/m   Constitutional:  Alert and oriented, no acute distress, nontoxic appearing HEENT: [place], AT Cardiovascular: No clubbing, cyanosis, or edema Respiratory: Normal respiratory effort, no increased work of breathing Skin: No rashes, bruises or suspicious lesions Neurologic: Grossly intact, no focal deficits, moving all 4 extremities Psychiatric: Normal mood and affect  Laboratory Data: Results for orders placed or performed in visit on 11/26/21  CULTURE, URINE COMPREHENSIVE   Specimen: Urine   CU  Result Value Ref Range   Urine Culture, Comprehensive Final report    Organism ID, Bacteria Comment   Mycoplasma / ureaplasma culture   Specimen: Urine   UR  Result Value Ref Range   Ureaplasma urealyticum Comment Negative   Mycoplasma hominis  Culture Comment Negative  Microscopic Examination   Urine  Result Value Ref Range   WBC, UA 0-5 0 - 5 /hpf   RBC, Urine 3-10 (A) 0 - 2 /hpf   Epithelial Cells (non renal) None seen 0 - 10 /hpf   Bacteria, UA Few None seen/Few  Urinalysis, Complete  Result Value Ref Range   Specific Gravity, UA 1.015 1.005 - 1.030   pH, UA 7.5 5.0 - 7.5   Color, UA Yellow Yellow   Appearance Ur Clear Clear   Leukocytes,UA Negative Negative   Protein,UA Negative Negative/Trace   Glucose, UA Negative Negative   Ketones, UA Negative Negative   RBC, UA Trace (A) Negative   Bilirubin, UA Negative Negative   Urobilinogen, Ur 0.2 0.2 - 1.0 mg/dL   Nitrite, UA Negative Negative   Microscopic Examination See below:    Assessment & Plan:   88 year old female with diabetes previously on Jardiance presents with concerns for recurrent UTIs.  Per chart review, she has had 6  grossly infected appearing UAs over the past year, however 3 of these grew mixed urogenital flora only.  It does appear that several of the samples may have been contaminated.  Her catheterized UA today is rather reassuring, however it is notable for microscopic hematuria consistent with her past UAs. 1. Microscopic hematuria She has had persistent microscopic hematuria over the past year regardless of culture outcome.  Catheterized UA sample today has microscopic hematuria as well.  We discussed that there are various etiologies for blood in the urine including infection, stones, cysts, anticoagulation, and urologic malignancies.  I recommended proceeding with a hematuria work-up at this time with a CT urogram and cystoscopy.  Her renal function should permit for the use of IV contrast.  She is in agreement with this plan. - CT HEMATURIA WORKUP; Future  2. Recurrent UTI UA today rather reassuring; will send for standard and atypical cultures and treat as indicated with plans for repeat UA to prove resolution of hematuria if positive.  We  discussed that her Vania Rea may have contributed to her recurrent UTIs, however further work-up is indicated for her microscopic hematuria as above.  We discussed that inflammatory conditions of the bladder or malignancy could be mimicking UTI symptoms, which would explain her multiple recent negative urine cultures.  I have asked that she seek care in our clinic for recurrent UTI symptoms.  I recommend obtaining catheterized urine samples only in this patient with a history of contaminated samples. - Urinalysis, Complete - CULTURE, URINE COMPREHENSIVE - Mycoplasma / ureaplasma culture  3. Urinary urgency Baseline urgency, frequency, and mixed stress and urge incontinence consistent with OAB wet.  We discussed that this may be a distinct clinical entity or related to #1 and #2 as above.  Will defer pharmacotherapy at this time and consider beta 3 agonist in the future pending her hematuria work-up as above.  Return in about 3 weeks (around 12/17/2021) for Cysto with Dr. Diamantina Providence.  [name], PA-C  I spent 45 minutes on the day of the encounter to include pre-visit record review, face-to-face time with the patient, and post-visit ordering of tests.   [facility] [address] [place], [place] [address] [phone]

## 2021-11-26 NOTE — Patient Instructions (Signed)

## 2021-11-26 NOTE — Progress Notes (Signed)
In and Out Catheterization  Patient is present today for a I & O catheterization. Patient was cleaned and prepped in a sterile fashion with betadine . A 14FR cath was inserted no complications were noted , 64m of urine return was noted, urine was clear yellow in color. A clean urine sample was collected for UA &CX. Bladder was drained  And catheter was removed with out difficulty.    Performed by: SFonnie Jarvis CMA

## 2021-11-29 LAB — CULTURE, URINE COMPREHENSIVE

## 2021-12-03 LAB — MYCOPLASMA / UREAPLASMA CULTURE
Mycoplasma hominis Culture: NEGATIVE
Ureaplasma urealyticum: NEGATIVE

## 2021-12-10 ENCOUNTER — Ambulatory Visit
Admission: RE | Admit: 2021-12-10 | Discharge: 2021-12-10 | Disposition: A | Discharge status: Home / Self Care | Payer: Medicare PPO | Source: Ambulatory Visit | Attending: Physician Assistant | Admitting: Physician Assistant

## 2021-12-10 DIAGNOSIS — R3129 Other microscopic hematuria: Secondary | ICD-10-CM

## 2021-12-10 LAB — POCT I-STAT CREATININE: Creatinine, Ser: 1.3 mg/dL — ABNORMAL HIGH (ref 0.44–1.00)

## 2021-12-10 MED ORDER — IOHEXOL 300 MG/ML  SOLN
100.0000 mL | Freq: Once | INTRAMUSCULAR | Status: AC | PRN
Start: 2021-12-10 — End: 2021-12-10
  Administered 2021-12-10: 100 mL via INTRAVENOUS

## 2021-12-12 ENCOUNTER — Ambulatory Visit: Payer: Medicare PPO | Admitting: Urology

## 2021-12-12 ENCOUNTER — Encounter: Payer: Self-pay | Admitting: Urology

## 2021-12-12 VITALS — BP 137/74 | HR 66 | Ht 67.0 in | Wt 152.0 lb

## 2021-12-12 DIAGNOSIS — R351 Nocturia: Secondary | ICD-10-CM

## 2021-12-12 DIAGNOSIS — R3129 Other microscopic hematuria: Secondary | ICD-10-CM

## 2021-12-12 DIAGNOSIS — N2889 Other specified disorders of kidney and ureter: Secondary | ICD-10-CM

## 2021-12-12 DIAGNOSIS — N390 Urinary tract infection, site not specified: Secondary | ICD-10-CM | POA: Diagnosis not present

## 2021-12-12 LAB — MICROSCOPIC EXAMINATION: Epithelial Cells (non renal): >10 /hpf — AB (ref 0–10)

## 2021-12-12 LAB — URINALYSIS, COMPLETE
Bilirubin, UA: NEGATIVE
Glucose, UA: NEGATIVE
Ketones, UA: NEGATIVE
Nitrite, UA: NEGATIVE
Protein,UA: NEGATIVE
Specific Gravity, UA: 1.015 (ref 1.005–1.030)
Urobilinogen, Ur: 0.2 mg/dL (ref 0.2–1.0)
pH, UA: 5.5 (ref 5.0–7.5)

## 2021-12-12 MED ORDER — ESTRADIOL 0.1 MG/GM VA CREA: TOPICAL_CREAM | VAGINAL | 6 refills | Status: DC

## 2021-12-12 NOTE — Progress Notes (Signed)
   12/12/2021 2:01 PM   Kim Hoover [birth year] [id number]  Reason for visit: Follow up recurrent UTIs, low-grade microscopic hematuria, small renal mass  HPI: I saw [name] and her son today for evaluation of the above issues.  She was originally scheduled for cystoscopy today, but she ultimately deferred.  I originally saw her in September 2020 for recurrent infections, and she was started on cranberry tablet prophylaxis and trimethoprim prophylaxis for 3 months.  She never followed up until July 2023 with [name] when she had had multiple Klebsiella UTIs after starting Jardiance.  She was also having some overactive symptoms of urgency, frequency, nocturia.  Most recent urinalysis showed low-grade microscopic hematuria with 3-10 RBCs, and [name] ordered a CT urogram and cystoscopy for further evaluation.  I personally viewed and interpreted the CT urogram dated 12/10/2021 that shows cystic kidneys bilaterally, including a 1.3 cm posterior left lower pole lesion that is borderline for possible enhancement, but no other suspicious abnormalities.  They would like to defer cystoscopy today.  We discussed the low, but not 0, risk of missing a clinically significant malignancy by deferring cystoscopy.  Currently, she really denies any significant urinary symptoms except for some mild urgency, nocturia 2-3 times at night.  She denies any dysuria or gross hematuria.  We discussed the relationship between Jardiance and increased infections.  I recommended topical estrogen cream and cranberry tablets for prophylaxis, and we could consider an OAB medication in the future if persistent symptoms but she is minimally bothered at this time.  We discussed that the topical estrogen cream sometimes can help with the urgency.  I also recommended close follow-up in 3 months and considering a repeat urinalysis at that visit to rule out any persistent microscopic hematuria that  would warrant reconsideration of clinic cystoscopy.  Regarding the small renal mass seen on CT, I recommended surveillance at this time with her age and comorbidities, and we discussed extremely low risk of progression or metastasis even if this represents a malignancy  -Trial of topical estrogen cream for recurrent infections -RTC 3 to 4 months repeat urinalysis, consider beta agonist OAB medication if persistently bothersome OAB symptoms -Reconsider cystoscopy if new gross hematuria or persistent/increased microscopic hematuria -Consider renal ultrasound versus CT in 6 to 12 months for surveillance of small left renal mass  Billey Co, MD  [facility] [address] [place], [place] [address] [phone]

## 2022-03-29 ENCOUNTER — Emergency Department: Payer: Medicare PPO

## 2022-03-29 ENCOUNTER — Emergency Department
Admission: EM | Admit: 2022-03-29 | Discharge: 2022-03-29 | Disposition: A | Discharge status: Home / Self Care | Payer: Medicare PPO | Attending: Emergency Medicine | Admitting: Emergency Medicine

## 2022-03-29 ENCOUNTER — Other Ambulatory Visit: Payer: Self-pay

## 2022-03-29 ENCOUNTER — Encounter: Payer: Self-pay | Admitting: Emergency Medicine

## 2022-03-29 DIAGNOSIS — E039 Hypothyroidism, unspecified: Secondary | ICD-10-CM | POA: Insufficient documentation

## 2022-03-29 DIAGNOSIS — Z85828 Personal history of other malignant neoplasm of skin: Secondary | ICD-10-CM | POA: Insufficient documentation

## 2022-03-29 DIAGNOSIS — Y92009 Unspecified place in unspecified non-institutional (private) residence as the place of occurrence of the external cause: Secondary | ICD-10-CM | POA: Insufficient documentation

## 2022-03-29 DIAGNOSIS — S52502A Unspecified fracture of the lower end of left radius, initial encounter for closed fracture: Secondary | ICD-10-CM

## 2022-03-29 DIAGNOSIS — S6992XA Unspecified injury of left wrist, hand and finger(s), initial encounter: Secondary | ICD-10-CM | POA: Diagnosis present

## 2022-03-29 DIAGNOSIS — S52592A Other fractures of lower end of left radius, initial encounter for closed fracture: Secondary | ICD-10-CM | POA: Insufficient documentation

## 2022-03-29 DIAGNOSIS — I10 Essential (primary) hypertension: Secondary | ICD-10-CM | POA: Diagnosis not present

## 2022-03-29 DIAGNOSIS — E119 Type 2 diabetes mellitus without complications: Secondary | ICD-10-CM | POA: Diagnosis not present

## 2022-03-29 DIAGNOSIS — W19XXXA Unspecified fall, initial encounter: Secondary | ICD-10-CM

## 2022-03-29 DIAGNOSIS — W010XXA Fall on same level from slipping, tripping and stumbling without subsequent striking against object, initial encounter: Secondary | ICD-10-CM | POA: Diagnosis not present

## 2022-03-29 MED ORDER — ACETAMINOPHEN 325 MG PO TABS
650.0000 mg | ORAL_TABLET | Freq: Once | ORAL | Status: AC
  Administered 2022-03-29: 650 mg via ORAL
  Filled 2022-03-29: qty 2

## 2022-03-29 MED ORDER — LIDOCAINE HCL (PF) 1 % IJ SOLN
20.0000 mL | Freq: Once | INTRAMUSCULAR | Status: AC
  Administered 2022-03-29: 20 mL
  Filled 2022-03-29: qty 20

## 2022-03-29 NOTE — ED Notes (Signed)
Patient's son is at bedside.

## 2022-03-29 NOTE — ED Triage Notes (Signed)
Patient to ED via [facility] from home for a mechanical fall. Patient states she was out on walk like she does most mornings and fell. Patient c/o pain on left arm after trying to catch herself. Denies hitting head or Loc, no blood thinners.

## 2022-03-29 NOTE — ED Notes (Signed)
First Nurse Note: Pt to ED via [facility] from home for a fall. Pt tripped and fell. Pt is denies LOC or hitting head. Pt is not on thinner. Pt has deformity to the Left wrist. Pt is having 5/10 pain.  Pt VSS per EMS.

## 2022-03-29 NOTE — ED Notes (Signed)
Ambulatory to bathroom. Gait steady. No signs of distress.  °

## 2022-03-29 NOTE — ED Provider Notes (Signed)
[facility] Provider Note    Event Date/Time   First MD Initiated Contact with Patient 03/29/22 1403     (approximate)   History   Fall (/)   HPI  Kim Hoover is a 88 y.o. female   Past medical history of diabetes, hypertension, hypothyroid, hyperlipidemia and skin cancer presents to the emergency department with chemical slip and fall onto outstretched arm left side wrist deformity and wrist injury.  No head strike, no loss of consciousness or blood thinners.  Was able to get up on her own and ambulate afterwards.  No other traumatic injuries noted by patient.  No other medical complaints.   History was obtained via the patient. Independent historian includes her son who is at bedside offering collateral information, mother is 75 years old and otherwise very healthy and fully able to perform daily activities of living.        Physical Exam   Triage Vital Signs: ED Triage Vitals [03/29/22 1232]  Enc Vitals Group     BP 122/85     Pulse Rate (!) 59     Resp 18     Temp 98 F (36.7 C)     Temp Source Oral     SpO2 93 %     Weight      Height      Head Circumference      Peak Flow      Pain Score 8     Pain Loc      Pain Edu?      Excl. in [place]?     Most recent vital signs: Vitals:   03/29/22 1232 03/29/22 1749  BP: 122/85 134/77  Pulse: (!) 59 74  Resp: 18 16  Temp: 98 F (36.7 C) 98 F (36.7 C)  SpO2: 93% 94%    General: Awake, no distress.  CV:  Good peripheral perfusion.  Resp:  Normal effort.  Abd:  No distention.  Other:  Neurovascular intact to the affected left wrist which has obvious deformity and tenderness to palpation.  No other traumatic injuries or bony tenderness noted to the remainder of the left upper extremity, other extremities thorax abdomen or head or neck.   ED Results / Procedures / Treatments   Labs (all labs ordered are listed, but only abnormal results are displayed) Labs Reviewed - No data  to display     RADIOLOGY I independently reviewed and interpreted wrist x-ray on the left and see a comminuted distal radius fracture.   PROCEDURES:  Critical Care performed: No  .Ortho Injury Treatment  Date/Time: 03/29/2022 8:05 PM  Performed by: Lucillie Garfinkel, MD Authorized by: Lucillie Garfinkel, MD   Consent:    Consent obtained:  Verbal   Consent given by:  Patient   Risks discussed:  Fracture   Alternatives discussed:  No treatment, alternative treatment, immobilization, referral and delayed treatmentInjury location: wrist Location details: left wrist Injury type: fracture Fracture type: distal radius Pre-procedure neurovascular assessment: neurovascularly intact Pre-procedure distal perfusion: normal Pre-procedure neurological function: normal Pre-procedure range of motion: reduced Anesthesia: hematoma block  Anesthesia: Local anesthesia used: yes Local Anesthetic: lidocaine 1% without epinephrine  Patient sedated: NoManipulation performed: yes Reduction successful: yes Immobilization: splint Splint type: sugar tong Splint Applied by: ED Provider Supplies used: cotton padding, elastic bandage and Ortho-Glass Post-procedure neurovascular assessment: post-procedure neurovascularly intact Post-procedure distal perfusion: normal Post-procedure neurological function: normal Post-procedure range of motion: unchanged      MEDICATIONS ORDERED IN ED:  Medications  acetaminophen (TYLENOL) tablet 650 mg (650 mg Oral Given 03/29/22 1609)  lidocaine (PF) (XYLOCAINE) 1 % injection 20 mL (20 mLs Other Given by Other 03/29/22 1733)    Consultants:  I spoke with orthopedist Dr. Mack Guise regarding care plan for this patient.   IMPRESSION / MDM / ASSESSMENT AND PLAN / ED COURSE  I reviewed the triage vital signs and the nursing notes.                              Differential diagnosis includes, but is not limited to, wrist fracture or dislocation, intracranial blunt  trauma, thoracic or abdominal or other extremity injury considered.  Less likely syncope given mechanical nature and no other symptoms.  MDM: We will reduce and splint the fracture of her wrist, in consultation with orthopedist will follow-up outpatient in clinic.  CT scan of the head given age and fall rule out intracranial bleeding.  Patient is adamant that she did not hit her head and has no headaches, wants to defer CT of the head at this time.  Reduced but lots of instability during procedure ; xr still displaced will f/u with krasisnki next week   Patient's presentation is most consistent with acute presentation with potential threat to life or bodily function.       FINAL CLINICAL IMPRESSION(S) / ED DIAGNOSES   Final diagnoses:  Closed fracture of distal end of left radius, unspecified fracture morphology, initial encounter  Fall, initial encounter     Rx / DC Orders   ED Discharge Orders     None        Note:  This document was prepared using Dragon voice recognition software and may include unintentional dictation errors.    Lucillie Garfinkel, MD 03/29/22 2006

## 2022-03-29 NOTE — Discharge Instructions (Signed)
Take acetaminophen 650 mg every 6 hours for pain.  Take with food.  Call Dr Mack Guise for follow up appointment next week.   Thank you for choosing [facility] for your health care today!  Please see your primary doctor this week for a follow up appointment.   If you do not have a primary doctor call the following clinics to establish care:  If you have insurance:  [facility] [phone] [place] [address]   [facility]  [phone] [place] [address]   If you do not have insurance:  [facility]  [phone] [address], [place] [address]  Sometimes, in the early stages of certain disease courses it is difficult to detect in the emergency department evaluation -- so, it is important that you continue to monitor your symptoms and call your doctor right away or return to the emergency department if you develop any new or worsening symptoms.  It was my pleasure to care for you today.   Hoover Brunette Jacelyn Grip, MD

## 2022-04-03 ENCOUNTER — Ambulatory Visit: Payer: Medicare PPO | Admitting: Urology

## 2022-07-24 ENCOUNTER — Encounter: Payer: Self-pay | Admitting: Urology

## 2022-07-24 ENCOUNTER — Ambulatory Visit: Payer: Medicare PPO | Admitting: Urology

## 2022-07-24 VITALS — BP 150/69 | HR 67 | Ht 67.0 in | Wt 150.0 lb

## 2022-07-24 DIAGNOSIS — R3129 Other microscopic hematuria: Secondary | ICD-10-CM

## 2022-07-24 DIAGNOSIS — N390 Urinary tract infection, site not specified: Secondary | ICD-10-CM

## 2022-07-24 DIAGNOSIS — N2889 Other specified disorders of kidney and ureter: Secondary | ICD-10-CM

## 2022-07-24 DIAGNOSIS — Z87440 Personal history of urinary (tract) infections: Secondary | ICD-10-CM | POA: Diagnosis not present

## 2022-07-24 LAB — URINALYSIS, COMPLETE
Bilirubin, UA: NEGATIVE
Glucose, UA: NEGATIVE
Ketones, UA: NEGATIVE
Leukocytes,UA: NEGATIVE
Nitrite, UA: NEGATIVE
Protein,UA: NEGATIVE
Specific Gravity, UA: 1.015 (ref 1.005–1.030)
Urobilinogen, Ur: 0.2 mg/dL (ref 0.2–1.0)
pH, UA: 7.5 (ref 5.0–7.5)

## 2022-07-24 LAB — MICROSCOPIC EXAMINATION

## 2022-07-24 MED ORDER — ESTRADIOL 0.1 MG/GM VA CREA: TOPICAL_CREAM | VAGINAL | 11 refills | Status: DC

## 2022-07-24 NOTE — Addendum Note (Signed)
Addended by: Donalee Citrin on: 07/24/2022 10:38 AM   Modules accepted: Orders

## 2022-07-24 NOTE — Progress Notes (Signed)
   07/24/2022 10:25 AM   Kim Hoover 08-18-1933 [id number]  Reason for visit: Follow up recurrent UTI, low-grade microscopic hematuria, small renal mass  HPI: 89 year old female who I previously saw in July 2023 for the above issues.  She was scheduled for cystoscopy at that time, but she ultimately deferred.    I originally saw her in September 2020 for recurrent UTIs, and she was started on cranberry tablet prophylaxis and trimethoprim prophylaxis for 3 months.  She never followed up until July 2023 when she had developed multiple Klebsiella UTIs after starting Jardiance.  She was also having some overactive symptoms of urgency, frequency, nocturia at that time.  A urinalysis at that time showed low-grade microscopic hematuria with 3-10 RBC, and our [facility] sent her for a CT urogram and cystoscopy for further evaluation.  The CT urogram dated 12/10/2021 showed cystic kidneys bilaterally including a 1.3 cm posterior left lower pole lesion that is borderline for possible enhancement, but no other suspicious abnormalities.   She reports a long history of microscopic hematuria that has been worked up as benign.  I recommended considering a repeat renal ultrasound in 6 to 12 months for surveillance of the small renal lesion, as well as a trial of topical estrogen cream for recurrent infections, and considering a beta 3 agonist OAB medication in the future if worsening OAB symptoms.  She has done well since our last visit.  She denies any UTIs on the topical estrogen cream, and continues to use this 3 times weekly.  She only has some very mild overactive symptoms of urgency but denies any urge incontinence.  She has very mild stress incontinence with heavy coughing or sneezing.  She denies any gross hematuria.  Urinalysis today stable with low-grade microscopic hematuria with 3-10 RBC.  I again offered cystoscopy, but she deferred which I think is reasonable with her long  history of asymptomatic microscopic hematuria that has been benign.  Regarding the small renal mass, I think it is reasonable to repeat a renal ultrasound in 9 months for surveillance, we reviewed this would be very unlikely to become symptomatic or metastasize even if this does represent a small renal tumor.  Continue topical estrogen cream, refilled RTC 9 months with renal ultrasound prior for surveillance of small 1.3 cm left renal mass   Billey Co, MD  [facility] [address] [place], [place] [address] [phone]

## 2023-03-17 ENCOUNTER — Ambulatory Visit
Admission: RE | Admit: 2023-03-17 | Discharge: 2023-03-17 | Disposition: A | Discharge status: Home / Self Care | Payer: Medicare PPO | Source: Ambulatory Visit | Attending: Urology | Admitting: Urology

## 2023-03-17 DIAGNOSIS — R3129 Other microscopic hematuria: Secondary | ICD-10-CM | POA: Diagnosis present

## 2023-04-17 ENCOUNTER — Ambulatory Visit: Payer: Medicare PPO | Admitting: Urology

## 2023-04-17 ENCOUNTER — Encounter: Payer: Self-pay | Admitting: Urology

## 2023-04-17 VITALS — BP 148/81 | HR 64 | Ht 67.0 in | Wt 155.0 lb

## 2023-04-17 DIAGNOSIS — Z87440 Personal history of urinary (tract) infections: Secondary | ICD-10-CM | POA: Diagnosis not present

## 2023-04-17 DIAGNOSIS — N289 Disorder of kidney and ureter, unspecified: Secondary | ICD-10-CM | POA: Diagnosis not present

## 2023-04-17 DIAGNOSIS — R3121 Asymptomatic microscopic hematuria: Secondary | ICD-10-CM

## 2023-04-17 DIAGNOSIS — N390 Urinary tract infection, site not specified: Secondary | ICD-10-CM

## 2023-04-17 LAB — URINALYSIS, COMPLETE
Bilirubin, UA: NEGATIVE
Glucose, UA: NEGATIVE
Ketones, UA: NEGATIVE
Leukocytes,UA: NEGATIVE
Nitrite, UA: NEGATIVE
Protein,UA: NEGATIVE
Specific Gravity, UA: 1.01 (ref 1.005–1.030)
Urobilinogen, Ur: 0.2 mg/dL (ref 0.2–1.0)
pH, UA: 7.5 (ref 5.0–7.5)

## 2023-04-17 LAB — MICROSCOPIC EXAMINATION

## 2023-04-17 MED ORDER — ESTRADIOL 0.1 MG/GM VA CREA: TOPICAL_CREAM | VAGINAL | 11 refills | Status: DC

## 2023-04-17 NOTE — Addendum Note (Signed)
Addended by: Sueanne Margarita on: 04/17/2023 11:53 AM   Modules accepted: Orders

## 2023-04-17 NOTE — Progress Notes (Signed)
   04/17/2023 10:48 AM   Kim Hoover [place] 11-Jul-1933 [id number]  Reason for visit: Follow up recurrent UTI, low-grade microscopic hematuria, small left renal lesion  HPI: 89 year old female who has been well-managed on topical estrogen cream for recurrent UTIs.  She has had 1 UTI in the last year in October, and symptoms resolved with nitrofurantoin, culture was ultimately negative, but urinalysis did appear grossly infected.  She has a long history of low-grade microscopic hematuria and has deferred cystoscopy multiple times previously.  She had a CT ordered in July 2023 by Carman Ching, PA for UTIs which was relatively benign aside from an equivocal 1.3 cm left lower pole renal lesion.  With her age she opted for a repeat ultrasound at follow-up for surveillance.  I personally viewed and interpreted the renal ultrasound from October 2024 that shows no definitive left solid renal mass.  This likely represents a benign finding, no further imaging required with her age.  She has some baseline urgency and frequency, but not bothered enough to consider medication like Myrbetriq or Gemtesa.  She requested to give a urine today with her recent UTI, and I reviewed at length the difference between asymptomatic bacteriuria and true UTI.  Follow-up urine culture, if positive consider a short course of antibiotics with her mild increased urgency Consider OAB medication in the future if worsening urinary symptoms RTC 1 year symptom check  Sondra Come, MD  [facility] [address] [place], [place] [address] [phone]

## 2023-06-27 ENCOUNTER — Other Ambulatory Visit: Payer: Self-pay

## 2023-06-27 ENCOUNTER — Emergency Department
Admission: EM | Admit: 2023-06-27 | Discharge: 2023-06-27 | Disposition: A | Discharge status: Home / Self Care | Payer: Medicare PPO | Attending: Emergency Medicine | Admitting: Emergency Medicine

## 2023-06-27 ENCOUNTER — Emergency Department: Payer: Medicare PPO

## 2023-06-27 DIAGNOSIS — W1839XA Other fall on same level, initial encounter: Secondary | ICD-10-CM | POA: Insufficient documentation

## 2023-06-27 DIAGNOSIS — S76111A Strain of right quadriceps muscle, fascia and tendon, initial encounter: Secondary | ICD-10-CM | POA: Diagnosis not present

## 2023-06-27 DIAGNOSIS — E119 Type 2 diabetes mellitus without complications: Secondary | ICD-10-CM | POA: Diagnosis not present

## 2023-06-27 DIAGNOSIS — I10 Essential (primary) hypertension: Secondary | ICD-10-CM | POA: Diagnosis not present

## 2023-06-27 DIAGNOSIS — S79921A Unspecified injury of right thigh, initial encounter: Secondary | ICD-10-CM | POA: Diagnosis present

## 2023-06-27 MED ORDER — WALKER MISC: 1.0000 | 0 refills | Status: AC | PRN

## 2023-06-27 MED ORDER — ACETAMINOPHEN 325 MG PO TABS
650.0000 mg | ORAL_TABLET | Freq: Once | ORAL | Status: AC
  Administered 2023-06-27: 650 mg via ORAL
  Filled 2023-06-27: qty 2

## 2023-06-27 MED ORDER — ONDANSETRON 4 MG PO TBDP
4.0000 mg | ORAL_TABLET | Freq: Once | ORAL | Status: DC
  Filled 2023-06-27: qty 1

## 2023-06-27 MED ORDER — HYDROCODONE-ACETAMINOPHEN 5-325 MG PO TABS: 1.0000 | ORAL_TABLET | Freq: Once | ORAL | Status: DC

## 2023-06-27 NOTE — ED Provider Notes (Signed)
[facility] Provider Note    Event Date/Time   First MD Initiated Contact with Patient 06/27/23 1029     (approximate)   History   Leg Pain   HPI  Kim Hoover is a 89 y.o. female with a past history of hypertension and diabetes who comes ED complaining of right upper thigh pain that started this morning.  Reports that she just bought a foot elliptical exercise device and last night used it for the first time for an hour before bed.  No falls, no motor weakness.  No fever    Physical Exam   Triage Vital Signs: ED Triage Vitals  Encounter Vitals Group     BP 06/27/23 0814 (!) 168/54     Systolic BP Percentile --      Diastolic BP Percentile --      Pulse Rate 06/27/23 0814 65     Resp 06/27/23 0814 18     Temp 06/27/23 0814 (!) 97.5 F (36.4 C)     Temp Source 06/27/23 0814 Oral     SpO2 06/27/23 0814 96 %     Weight 06/27/23 0816 154 lb (69.9 kg)     Height 06/27/23 0816 5\' 7"  (1.702 m)     Head Circumference --      Peak Flow --      Pain Score 06/27/23 0816 4     Pain Loc --      Pain Education --      Exclude from Growth Chart --     Most recent vital signs: Vitals:   06/27/23 0814  BP: (!) 168/54  Pulse: 65  Resp: 18  Temp: (!) 97.5 F (36.4 C)  SpO2: 96%    General: Awake, no distress.  CV:  Good peripheral perfusion.  Resp:  Normal effort.  Abd:  No distention.  Other:  Tenderness at the right proximal quadriceps.  Pain reproduced with hip flexion against minimal resistance.  No bony tenderness.  Intact range of motion.   ED Results / Procedures / Treatments   Labs (all labs ordered are listed, but only abnormal results are displayed) Labs Reviewed - No data to display   RADIOLOGY X-ray right hip negative for fracture   PROCEDURES:  Procedures   MEDICATIONS ORDERED IN ED: Medications  acetaminophen (TYLENOL) tablet 650 mg (650 mg Oral Given 06/27/23 0946)     IMPRESSION / MDM / ASSESSMENT AND  PLAN / ED COURSE  I reviewed the triage vital signs and the nursing notes.                             Patient presents with right hip pain after engaging in new exercise that requires repetitive hip flexion.  Exam demonstrates reproducible musculoskeletal pain consistent with muscle strain.  Counseled patient on Tylenol, heat therapy, graduated increase in new exercise.      FINAL CLINICAL IMPRESSION(S) / ED DIAGNOSES   Final diagnoses:  Quadriceps strain, right, initial encounter     Rx / DC Orders   ED Discharge Orders          Ordered    Misc. Devices Dan Humphreys) MISC  As needed        06/27/23 1048             Note:  This document was prepared using Dragon voice recognition software and may include unintentional dictation errors.   Sharman Cheek, MD 06/27/23 1051

## 2023-06-27 NOTE — ED Triage Notes (Signed)
First nurse note:Arrived by [facility] from home for groin pain that radiates down to inside of right leg. Describes burning sensation. C/o diarrhea  Reports fall on April 29, 2023  EMS vitals: 166/64 b/p 95% RA 60HR [id number]

## 2023-06-27 NOTE — ED Provider Triage Note (Signed)
Emergency Medicine Provider Triage Evaluation Note  Kim Hoover , a 89 y.o. female  was evaluated in triage.  Pt complains of right hip and thigh pain. Fell 12/3 and fx right thumb.  No xray of hip.  Also using an elliptical machine for an hour and a half prior to pain.  Review of Systems  Positive: Right hip and thigh pain.  Pain with weight bearing Negative: No n,v, fever  Physical Exam  BP (!) 168/54 (BP Location: Left Arm)   Pulse 65   Temp (!) 97.5 F (36.4 C) (Oral)   Resp 18   SpO2 96%  Gen:   Awake, no distress   Resp:  Normal effort  MSK:   Pain with movement right lower extremity Other:    Medical Decision Making  Medically screening exam initiated at 8:16 AM.  Appropriate orders placed.  Kim Hoover was informed that the remainder of the evaluation will be completed by another provider, this initial triage assessment does not replace that evaluation, and the importance of remaining in the ED until their evaluation is complete.     Kim Rumps, PA-C 06/27/23 [phone]

## 2023-06-27 NOTE — ED Triage Notes (Signed)
Refer to First Nurse Note

## 2024-01-01 ENCOUNTER — Encounter: Payer: Self-pay | Admitting: Urology

## 2024-04-28 ENCOUNTER — Ambulatory Visit: Admitting: Urology

## 2024-04-28 VITALS — BP 145/81 | HR 72 | Wt 154.0 lb

## 2024-04-28 DIAGNOSIS — N390 Urinary tract infection, site not specified: Secondary | ICD-10-CM

## 2024-04-28 DIAGNOSIS — N3281 Overactive bladder: Secondary | ICD-10-CM | POA: Diagnosis not present

## 2024-04-28 MED ORDER — GEMTESA 75 MG PO TABS: 75.0000 mg | ORAL_TABLET | Freq: Every day | ORAL | Status: AC

## 2024-04-28 MED ORDER — ESTRADIOL 0.01 % VA CREA: TOPICAL_CREAM | VAGINAL | 12 refills | Status: AC

## 2024-04-28 NOTE — Progress Notes (Signed)
   04/28/2024 2:08 PM   Kim Hoover 1933/10/15 [id number]  Reason for visit: Follow up recurrent UTI, overactive bladder, possible renal mass  History: History of recurrent UTI, has done extremely well on topical estrogen cream with 1 UTI in the last 3 years Baseline mild to moderate OAB symptoms with urgency, frequency, occasional urge incontinence.  Also has some mild stress incontinence Possible small 1.3 cm left lower pole renal mass seen on CT in 2023, but follow-up renal ultrasound showed no evidence of a solid mass and she deferred further workup with her age  Physical Exam: BP (!) 145/81 (BP Location: Left Arm, Patient Position: Sitting, Cuff Size: Normal)   Pulse 72   Wt 154 lb (69.9 kg)   SpO2 94%   BMI 24.12 kg/m   Imaging/labs: Renal ultrasound November 2024 with no evidence of solid renal mass, no hydronephrosis  Today: Denies any UTIs over the last year, overall doing well Overactive symptoms may be slightly worse with urgency, frequency, occasional urge incontinence, as well as mild stress incontinence.  She drinks a fair amount of coffee and tea during the day, she is at the point she would be interested in trying a medicine.   Much of the history obtained from her son who is with her today  Plan:   Recurrent UTI: Continue topical estrogen cream, doing extremely well Overactive bladder: At the point where she would like to trial medications, samples of Gemtesa given, okay to fill if helpful Stress incontinence: Reviewed Kegel exercises, need to have realistic expectations with her age Possible renal mass: Not seen on recent renal ultrasound, no further imaging needed with her age RTC 1 year PA   Kim JAYSON Burnet, MD  [facility] [address], [address] [place], [place] [address] [phone]

## 2024-04-28 NOTE — Patient Instructions (Signed)
 Estrogen Cream Instructions  Discard applicator  Apply pea sized amount to tip of finger to urethra before bed every night for 1 week the use Monday, Wednesday and Friday. Wash hands well after application.

## 2024-04-29 ENCOUNTER — Ambulatory Visit: Payer: Self-pay | Admitting: Urology

## 2025-04-28 ENCOUNTER — Ambulatory Visit: Admitting: Physician Assistant
